# Patient Record
Sex: Male | Born: 2000 | Race: White | Hispanic: No | Marital: Single | Smoking: Never smoker
Health system: Southern US, Community
[De-identification: ages and names within clinical notes are randomized; demographics above are authoritative.]

## PROBLEM LIST (undated history)

## (undated) HISTORY — PX: WISDOM TOOTH EXTRACTION: SHX21

---
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
## 2022-10-08 ENCOUNTER — Ambulatory Visit (HOSPITAL_COMMUNITY)
Admission: EM | Admit: 2022-10-08 | Discharge: 2022-10-08 | Disposition: A | Discharge status: Home / Self Care | Payer: 59 | Attending: Internal Medicine | Admitting: Internal Medicine

## 2022-10-08 ENCOUNTER — Encounter (HOSPITAL_COMMUNITY): Payer: Self-pay

## 2022-10-08 DIAGNOSIS — J069 Acute upper respiratory infection, unspecified: Secondary | ICD-10-CM | POA: Diagnosis present

## 2022-10-08 LAB — POCT RAPID STREP A (OFFICE): Rapid Strep A Screen: NEGATIVE

## 2022-10-08 MED ORDER — IBUPROFEN 100 MG/5ML PO SUSP
600.0000 mg | Freq: Once | ORAL | Status: AC
  Administered 2022-10-08: 600 mg via ORAL

## 2022-10-08 MED ORDER — IBUPROFEN 800 MG PO TABS: 800.0000 mg | ORAL_TABLET | Freq: Once | ORAL | Status: DC

## 2022-10-08 MED ORDER — IBUPROFEN 100 MG/5ML PO SUSP
ORAL | Status: AC
  Filled 2022-10-08: qty 30

## 2022-10-08 MED ORDER — BENZONATATE 100 MG PO CAPS: 100.0000 mg | ORAL_CAPSULE | Freq: Three times a day (TID) | ORAL | 0 refills | Status: AC

## 2022-10-08 NOTE — Discharge Instructions (Addendum)
Your strep testing of your throat in the clinic is negative. Throat culture has been sent to the lab to further check for bacteria to the back of your throat, staff will call you if this is positive in the next 2-3 days.   Use the following medicines to help with symptoms: - Ibuprofen 600mg  and/or Tylenol 1,000mg  every 6 hours with food as needed for aches/pains or fever/chills.  - 1 tablespoon of honey in warm water and/or salt water gargles may also help with symptoms.  - Tessalon perles every 8 hours as needed for cough  If you develop any new or worsening symptoms, please return.  If your symptoms are severe, please go to the emergency room.  Follow-up with your primary care provider for further evaluation and management of your symptoms as well as ongoing wellness visits.  I hope you feel better!

## 2022-10-08 NOTE — ED Provider Notes (Signed)
[facility]    CSN: [id number] Arrival date & time: 10/08/22  1552      History   Chief Complaint Chief Complaint  Patient presents with   Otalgia   Sore Throat    HPI Bobby Simmons is a 22 y.o. male.   Patient presents to urgent care for evaluation of sore throat, cough, bilateral ear pain/pressure, and hot/cold chills that started 2 days ago on Thursday, Oct 06, 2022.  No recent known sick contacts with similar symptoms.  Cough is mostly dry and nonproductive.  He describes his symptoms as "seasonal allergies on steroids".  Sore throat is currently a 7 on a scale of 0-10 and worse with talking, coughing, and swallowing.  He has also started to notice some voice hoarseness but denies difficulty maintaining secretions or trouble swallowing.  Denies nasal congestion and rhinorrhea.  No history of chronic respiratory problems.  No rash, nausea, vomiting, diarrhea, abdominal pain, or recent antibiotic/steroid use.  Never smoker, denies drug use.  Using over-the-counter Children's Claritin as well as Flintstone vitamins to help with symptoms without relief.   Otalgia Sore Throat    History reviewed. No pertinent past medical history.  There are no problems to display for this patient.   Past Surgical History:  Procedure Laterality Date   WISDOM TOOTH EXTRACTION Bilateral        Home Medications    Prior to Admission medications   Medication Sig Start Date End Date Taking? Authorizing Provider  Loratadine (CLARITIN PO) Take by mouth.   Yes [provider]    Family History History reviewed. No pertinent family history.  Social History Social History   Tobacco Use   Smoking status: Never   Smokeless tobacco: Never  Vaping Use   Vaping Use: Never used  Substance Use Topics   Alcohol use: Yes   Drug use: Never     Allergies   Patient has no known allergies.   Review of Systems Review of Systems  HENT:  Positive for ear pain.    Per  HPI  Physical Exam Triage Vital Signs ED Triage Vitals  Enc Vitals Group     BP 10/08/22 1609 121/73     Pulse Rate 10/08/22 1609 97     Resp 10/08/22 1609 18     Temp 10/08/22 1609 98.9 F (37.2 C)     Temp Source 10/08/22 1609 Oral     SpO2 10/08/22 1609 97 %     Weight --      Height 10/08/22 1609 5\' 8"  (1.727 m)     Head Circumference --      Peak Flow --      Pain Score 10/08/22 1607 6     Pain Loc --      Pain Edu? --      Excl. in GC? --    No data found.  Updated Vital Signs BP 121/73 (BP Location: Right Arm)   Pulse 97   Temp 98.9 F (37.2 C) (Oral)   Resp 18   Ht 5\' 8"  (1.727 m)   SpO2 97%   Visual Acuity Right Eye Distance:   Left Eye Distance:   Bilateral Distance:    Right Eye Near:   Left Eye Near:    Bilateral Near:     Physical Exam Vitals and nursing note reviewed.  Constitutional:      Appearance: He is not ill-appearing or toxic-appearing.  HENT:     Head: Normocephalic and atraumatic.  Right Ear: Hearing, ear canal and external ear normal. A middle ear effusion is present.     Left Ear: Hearing, ear canal and external ear normal. A middle ear effusion is present.     Nose: Nose normal.     Mouth/Throat:     Lips: Pink.     Mouth: Mucous membranes are moist. No injury.     Tongue: No lesions. Tongue does not deviate from midline.     Palate: No mass and lesions.     Pharynx: Uvula midline. Pharyngeal swelling and posterior oropharyngeal erythema present. No oropharyngeal exudate or uvula swelling.     Tonsils: No tonsillar exudate or tonsillar abscesses. 2+ on the right. 2+ on the left.  Eyes:     General: Lids are normal. Vision grossly intact. Gaze aligned appropriately.     Extraocular Movements: Extraocular movements intact.     Conjunctiva/sclera: Conjunctivae normal.  Cardiovascular:     Rate and Rhythm: Normal rate and regular rhythm.     Heart sounds: Normal heart sounds, S1 normal and S2 normal.  Pulmonary:     Effort:  Pulmonary effort is normal. No respiratory distress.     Breath sounds: Normal breath sounds and air entry.  Musculoskeletal:     Cervical back: Full passive range of motion without pain and neck supple.  Lymphadenopathy:     Cervical: Cervical adenopathy present.     Right cervical: Superficial cervical adenopathy present.     Left cervical: Superficial cervical adenopathy present.  Skin:    General: Skin is warm and dry.     Capillary Refill: Capillary refill takes less than 2 seconds.     Findings: No rash.  Neurological:     General: No focal deficit present.     Mental Status: He is alert and oriented to person, place, and time. Mental status is at baseline.     Cranial Nerves: No dysarthria or facial asymmetry.  Psychiatric:        Mood and Affect: Mood normal.        Speech: Speech normal.        Behavior: Behavior normal.        Thought Content: Thought content normal.        Judgment: Judgment normal.      UC Treatments / Results  Labs (all labs ordered are listed, but only abnormal results are displayed) Labs Reviewed  CULTURE, GROUP A STREP [facility])  POCT RAPID STREP A (OFFICE)    EKG   Radiology No results found.  Procedures Procedures (including critical care time)  Medications Ordered in UC Medications  ibuprofen (ADVIL) 100 MG/5ML suspension 600 mg (600 mg Oral Given 10/08/22 1651)    Initial Impression / Assessment and Plan / UC Course  I have reviewed the triage vital signs and the nursing notes.  Pertinent labs & imaging results that were available during my care of the patient were reviewed by me and considered in my medical decision making (see chart for details).   1.  Viral URI with cough Symptoms and physical exam consistent with a viral upper respiratory tract infection that will likely resolve with rest, fluids, and prescriptions for symptomatic relief. Deferred imaging based on stable cardiopulmonary exam and hemodynamically stable vital  signs.  Group A strep testing in clinic is negative, throat culture pending.  Patient given ibuprofen 600 mg suspension in clinic today for throat pain.  Tessalon Perles sent to pharmacy for symptomatic relief to be taken as prescribed.  May continue taking over the counter medications as directed for further symptomatic relief.  Nonpharmacologic interventions for symptom relief provided and after visit summary below. Advised to push fluids to stay well hydrated while recovering from viral illness.   Discussed physical exam and available lab work findings in clinic with patient.  Counseled patient regarding appropriate use of medications and potential side effects for all medications recommended or prescribed today. Discussed red flag signs and symptoms of worsening condition,when to call the PCP office, return to urgent care, and when to seek higher level of care in the emergency department. Patient verbalizes understanding and agreement with plan. All questions answered. Patient discharged in stable condition.     Final Clinical Impressions(s) / UC Diagnoses   Final diagnoses:  Viral URI with cough     Discharge Instructions      Your strep testing of your throat in the clinic is negative. Throat culture has been sent to the lab to further check for bacteria to the back of your throat, staff will call you if this is positive in the next 2-3 days.   Use the following medicines to help with symptoms: - Ibuprofen 600mg  and/or Tylenol 1,000mg  every 6 hours with food as needed for aches/pains or fever/chills.  - 1 tablespoon of honey in warm water and/or salt water gargles may also help with symptoms.  - Tessalon perles every 8 hours as needed for cough  If you develop any new or worsening symptoms, please return.  If your symptoms are severe, please go to the emergency room.  Follow-up with your primary care provider for further evaluation and management of your symptoms as well as ongoing  wellness visits.  I hope you feel better!       ED Prescriptions   None    PDMP not reviewed this encounter.   Carlisle Beers, [place] 10/08/22 [phone]

## 2022-10-08 NOTE — ED Triage Notes (Signed)
Sore throat, headache, and ear pain for 2 days. Fullness and pressure in the right ear. Having slight cough and runny nose. No fever. No known sick exposure.   Tried Mucinex and cough drops with little relief.

## 2022-10-10 LAB — CULTURE, GROUP A STREP (THRC)

## 2022-10-11 LAB — CULTURE, GROUP A STREP (THRC)
